# Patient Record
Sex: Male | Born: 1965 | Hispanic: No | Marital: Single | Smoking: Never smoker
Health system: Southern US, Community
[De-identification: ages and names within clinical notes are randomized; demographics above are authoritative.]

## PROBLEM LIST (undated history)

## (undated) DIAGNOSIS — E119 Type 2 diabetes mellitus without complications: Secondary | ICD-10-CM

## (undated) DIAGNOSIS — I2699 Other pulmonary embolism without acute cor pulmonale: Secondary | ICD-10-CM

## (undated) DIAGNOSIS — E785 Hyperlipidemia, unspecified: Secondary | ICD-10-CM

## (undated) DIAGNOSIS — E079 Disorder of thyroid, unspecified: Secondary | ICD-10-CM

## (undated) DIAGNOSIS — I10 Essential (primary) hypertension: Secondary | ICD-10-CM

## (undated) DIAGNOSIS — M109 Gout, unspecified: Secondary | ICD-10-CM

## (undated) HISTORY — DX: Type 2 diabetes mellitus without complications: E11.9

## (undated) HISTORY — PX: OTHER SURGICAL HISTORY: SHX169

## (undated) HISTORY — DX: Essential (primary) hypertension: I10

## (undated) HISTORY — DX: Gout, unspecified: M10.9

## (undated) HISTORY — DX: Disorder of thyroid, unspecified: E07.9

## (undated) HISTORY — DX: Hyperlipidemia, unspecified: E78.5

## (undated) HISTORY — PX: BACK SURGERY: SHX140

## (undated) HISTORY — DX: Other pulmonary embolism without acute cor pulmonale: I26.99

---

## 2004-02-22 ENCOUNTER — Emergency Department: Payer: Self-pay | Admitting: Emergency Medicine

## 2004-12-11 ENCOUNTER — Ambulatory Visit: Payer: Self-pay | Admitting: Family Medicine

## 2004-12-21 ENCOUNTER — Ambulatory Visit: Payer: Self-pay | Admitting: Family Medicine

## 2005-05-26 ENCOUNTER — Ambulatory Visit: Payer: Self-pay | Admitting: Family Medicine

## 2005-05-28 ENCOUNTER — Ambulatory Visit: Payer: Self-pay | Admitting: Urology

## 2005-06-03 ENCOUNTER — Ambulatory Visit: Payer: Self-pay | Admitting: Urology

## 2005-07-12 ENCOUNTER — Ambulatory Visit: Payer: Self-pay | Admitting: Urology

## 2007-03-31 ENCOUNTER — Ambulatory Visit: Payer: Self-pay | Admitting: Urology

## 2007-04-06 ENCOUNTER — Encounter: Admission: RE | Admit: 2007-04-06 | Discharge: 2007-04-06 | Payer: Self-pay | Admitting: Occupational Medicine

## 2008-03-27 ENCOUNTER — Encounter: Admission: RE | Admit: 2008-03-27 | Discharge: 2008-03-27 | Payer: Self-pay | Admitting: Occupational Medicine

## 2008-04-04 ENCOUNTER — Ambulatory Visit: Payer: Self-pay | Admitting: Urology

## 2008-04-21 ENCOUNTER — Emergency Department: Payer: Self-pay | Admitting: Emergency Medicine

## 2008-07-06 ENCOUNTER — Emergency Department: Payer: Self-pay | Admitting: Emergency Medicine

## 2008-10-30 ENCOUNTER — Ambulatory Visit: Payer: Self-pay | Admitting: Family Medicine

## 2008-12-07 ENCOUNTER — Ambulatory Visit: Payer: Self-pay | Admitting: Otolaryngology

## 2009-03-27 ENCOUNTER — Encounter: Admission: RE | Admit: 2009-03-27 | Discharge: 2009-03-27 | Payer: Self-pay | Admitting: Family Medicine

## 2010-06-22 ENCOUNTER — Ambulatory Visit: Payer: Self-pay | Admitting: Otolaryngology

## 2010-07-10 ENCOUNTER — Ambulatory Visit
Admission: RE | Admit: 2010-07-10 | Discharge: 2010-07-10 | Disposition: A | Discharge status: Home / Self Care | Payer: Self-pay | Source: Ambulatory Visit | Attending: Family Medicine | Admitting: Family Medicine

## 2010-07-10 ENCOUNTER — Other Ambulatory Visit: Payer: Self-pay | Admitting: Family Medicine

## 2010-07-10 DIAGNOSIS — Z0000 Encounter for general adult medical examination without abnormal findings: Secondary | ICD-10-CM

## 2010-08-01 IMAGING — CR DG CHEST 1V
1 series · 1 of 1 positions shown · non-contrast
Comparison: 04/06/2007

CLINICAL DATA: Physical clearance.

CHEST - 1 VIEW

[view not recorded]
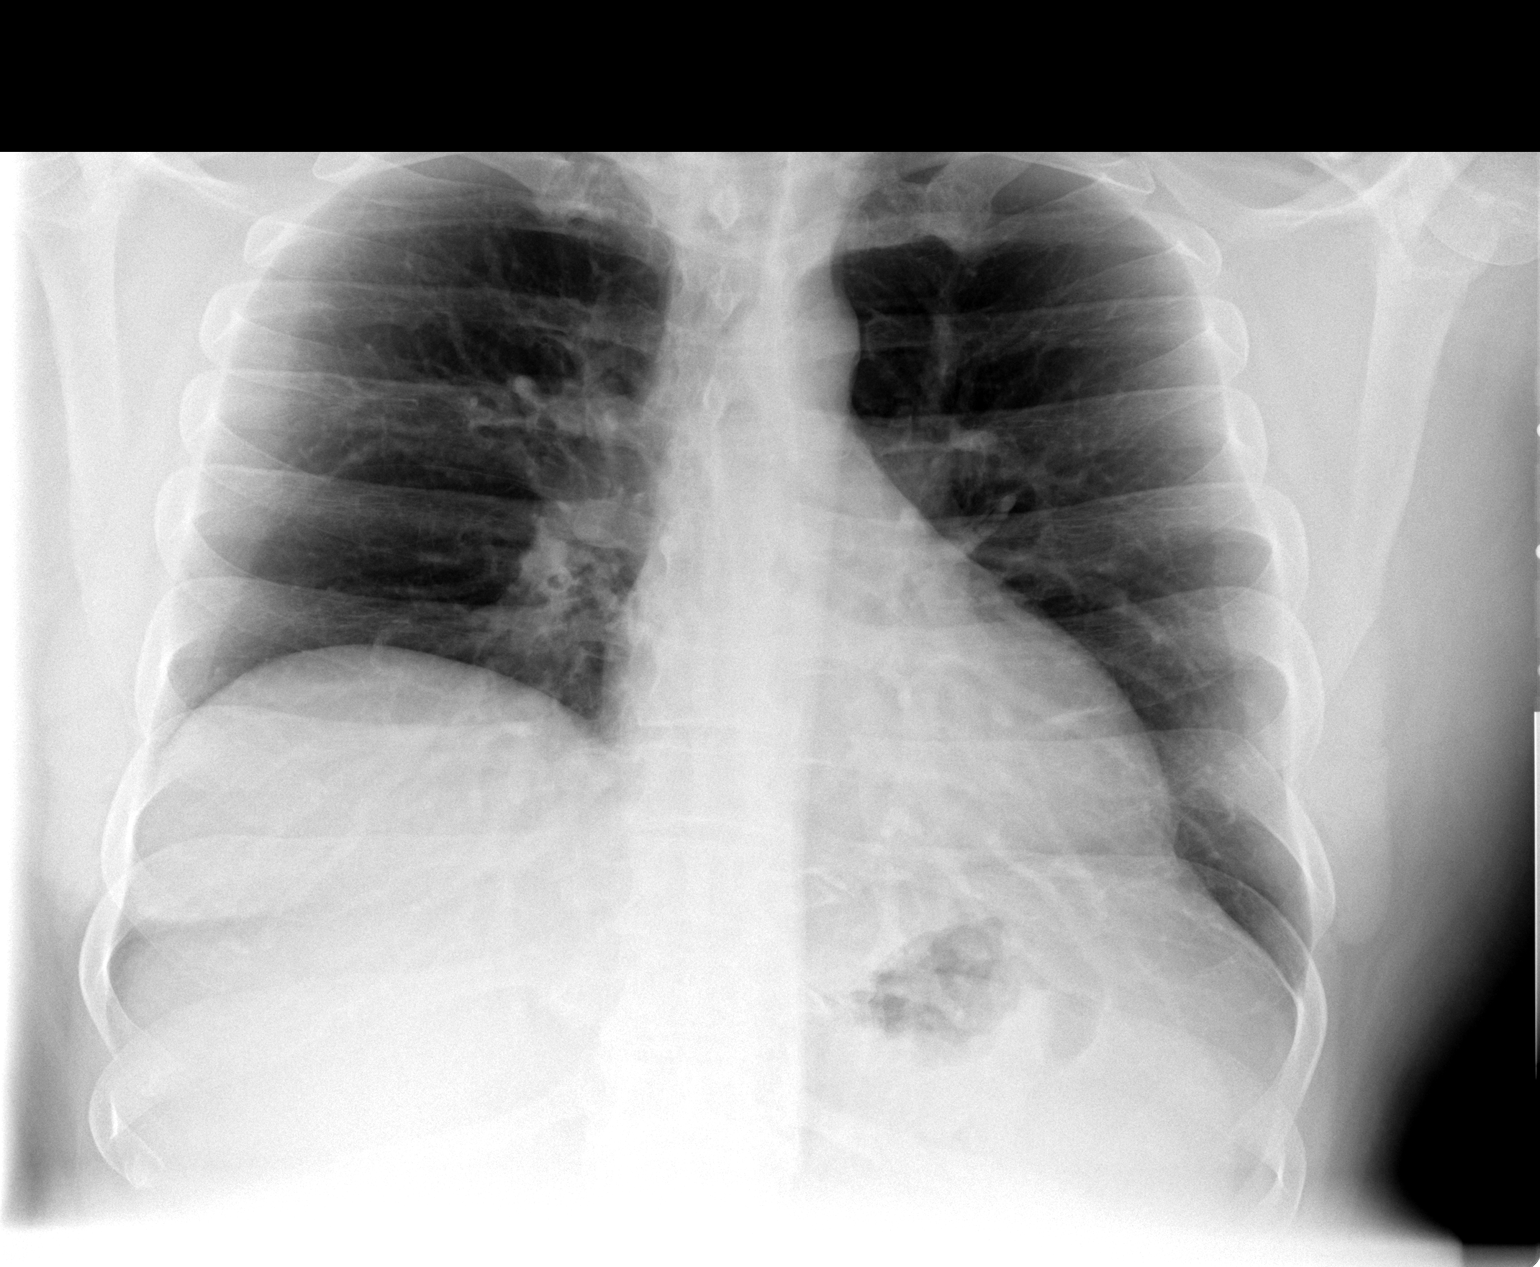

[1 of 1 positions shown; findings below may reference images not displayed]

FINDINGS: Trachea is midline.  Heart size stable.  Lungs are clear.
No pleural fluid.
IMPRESSION: No acute findings.

## 2011-03-14 ENCOUNTER — Emergency Department: Payer: Self-pay | Admitting: Emergency Medicine

## 2011-03-22 ENCOUNTER — Ambulatory Visit: Payer: Self-pay | Admitting: Family Medicine

## 2011-10-20 ENCOUNTER — Ambulatory Visit: Payer: Self-pay | Admitting: Emergency Medicine

## 2011-10-20 LAB — URINALYSIS, COMPLETE
Bilirubin,UR: NEGATIVE
Leukocyte Esterase: NEGATIVE
Ph: 5 (ref 4.5–8.0)
Protein: 30
Squamous Epithelial: NONE SEEN

## 2011-11-04 ENCOUNTER — Ambulatory Visit: Payer: Self-pay | Admitting: Urology

## 2011-11-10 ENCOUNTER — Ambulatory Visit: Payer: Self-pay | Admitting: Urology

## 2011-11-16 ENCOUNTER — Ambulatory Visit: Payer: Self-pay | Admitting: Urology

## 2011-11-16 LAB — BASIC METABOLIC PANEL
Anion Gap: 3 — ABNORMAL LOW (ref 7–16)
BUN: 13 mg/dL (ref 7–18)
Chloride: 104 mmol/L (ref 98–107)
Co2: 31 mmol/L (ref 21–32)
Creatinine: 1.37 mg/dL — ABNORMAL HIGH (ref 0.60–1.30)
Potassium: 3.5 mmol/L (ref 3.5–5.1)
Sodium: 138 mmol/L (ref 136–145)

## 2011-12-02 DIAGNOSIS — N200 Calculus of kidney: Secondary | ICD-10-CM | POA: Insufficient documentation

## 2011-12-02 DIAGNOSIS — R3129 Other microscopic hematuria: Secondary | ICD-10-CM | POA: Insufficient documentation

## 2012-03-03 ENCOUNTER — Ambulatory Visit: Payer: Self-pay | Admitting: Urology

## 2012-03-03 DIAGNOSIS — N200 Calculus of kidney: Secondary | ICD-10-CM | POA: Insufficient documentation

## 2012-03-16 ENCOUNTER — Ambulatory Visit: Payer: Self-pay | Admitting: Urology

## 2012-03-31 ENCOUNTER — Ambulatory Visit: Payer: Self-pay | Admitting: Urology

## 2012-08-11 ENCOUNTER — Other Ambulatory Visit: Payer: Self-pay | Admitting: Emergency Medicine

## 2012-08-11 ENCOUNTER — Ambulatory Visit (INDEPENDENT_AMBULATORY_CARE_PROVIDER_SITE_OTHER): Payer: Self-pay

## 2012-08-11 DIAGNOSIS — Z0000 Encounter for general adult medical examination without abnormal findings: Secondary | ICD-10-CM

## 2013-06-29 ENCOUNTER — Emergency Department: Payer: Self-pay | Admitting: Emergency Medicine

## 2013-06-29 LAB — CBC
HCT: 47.1 % (ref 40.0–52.0)
HGB: 15.6 g/dL (ref 13.0–18.0)
MCH: 30.5 pg (ref 26.0–34.0)
MCHC: 33 g/dL (ref 32.0–36.0)
MCV: 92 fL (ref 80–100)
Platelet: 218 10*3/uL (ref 150–440)
RBC: 5.1 10*6/uL (ref 4.40–5.90)
RDW: 14.7 % — AB (ref 11.5–14.5)
WBC: 9.5 10*3/uL (ref 3.8–10.6)

## 2013-06-29 LAB — LIPASE, BLOOD: Lipase: 328 U/L (ref 73–393)

## 2013-06-29 LAB — URINALYSIS, COMPLETE
BACTERIA: NONE SEEN
BILIRUBIN, UR: NEGATIVE
BLOOD: NEGATIVE
Ketone: NEGATIVE
LEUKOCYTE ESTERASE: NEGATIVE
NITRITE: NEGATIVE
PH: 5 (ref 4.5–8.0)
Protein: NEGATIVE
RBC, UR: NONE SEEN /HPF (ref 0–5)
SPECIFIC GRAVITY: 1.038 (ref 1.003–1.030)
Squamous Epithelial: <1
WBC UR: NONE SEEN /HPF (ref 0–5)

## 2013-06-29 LAB — D-DIMER(ARMC): D-DIMER: 644 ng/mL

## 2013-06-29 LAB — COMPREHENSIVE METABOLIC PANEL
ALBUMIN: 3.7 g/dL (ref 3.4–5.0)
AST: 31 U/L (ref 15–37)
Alkaline Phosphatase: 55 U/L
Anion Gap: 5 — ABNORMAL LOW (ref 7–16)
BUN: 12 mg/dL (ref 7–18)
Bilirubin,Total: 0.3 mg/dL (ref 0.2–1.0)
CALCIUM: 8.6 mg/dL (ref 8.5–10.1)
Chloride: 106 mmol/L (ref 98–107)
Co2: 28 mmol/L (ref 21–32)
Creatinine: 0.93 mg/dL (ref 0.60–1.30)
Glucose: 198 mg/dL — ABNORMAL HIGH (ref 65–99)
Osmolality: 283 (ref 275–301)
Potassium: 3.8 mmol/L (ref 3.5–5.1)
SGPT (ALT): 37 U/L (ref 12–78)
Sodium: 139 mmol/L (ref 136–145)
TOTAL PROTEIN: 7.6 g/dL (ref 6.4–8.2)

## 2013-06-29 LAB — CK TOTAL AND CKMB (NOT AT ARMC)
CK, TOTAL: 93 U/L
CK-MB: 0.9 ng/mL (ref 0.5–3.6)

## 2013-06-29 LAB — TROPONIN I: Troponin-I: <0.02 ng/mL

## 2013-09-13 DIAGNOSIS — E119 Type 2 diabetes mellitus without complications: Secondary | ICD-10-CM | POA: Insufficient documentation

## 2013-09-13 DIAGNOSIS — E669 Obesity, unspecified: Secondary | ICD-10-CM | POA: Insufficient documentation

## 2013-09-13 DIAGNOSIS — E039 Hypothyroidism, unspecified: Secondary | ICD-10-CM | POA: Insufficient documentation

## 2013-09-23 DIAGNOSIS — E538 Deficiency of other specified B group vitamins: Secondary | ICD-10-CM | POA: Insufficient documentation

## 2013-09-23 DIAGNOSIS — R809 Proteinuria, unspecified: Secondary | ICD-10-CM | POA: Insufficient documentation

## 2014-07-02 NOTE — Op Note (Signed)
PATIENT NAME:  Delfin, Toniann FailONY L MR#:  [id number] DATE OF BIRTH:  [birth year]  DATE OF PROCEDURE:  11/16/2011  PREOPERATIVE DIAGNOSIS: Left ureterolithiasis.   POSTOPERATIVE DIAGNOSIS: Left ureterolithiasis.   PROCEDURES: 1. Left ureteroscopy with holmium laser lithotripsy.  2. Left double-J ureteral stent placement.   SURGEON: Madolyn FriezeBrian S. Achilles Dunkope, MD  ANESTHESIA: Laryngeal mask airway anesthesia.   INDICATIONS: The patient is a 49 year old gentleman with a history of uric acid nephrolithiasis. He presented with recent onset of left-sided flank pain. A KUB demonstrated no definitive stones overlying the course of the ureter. Several bilateral renal calculi were appreciated. A subsequent CT scan, however, demonstrated an approximate 1.2 cm stone in the mid left ureter with partial obstruction. He presents for ureteroscopic stone removal.   DESCRIPTION OF PROCEDURE: After informed consent was obtained, the patient was taken to the Operating Room and placed in the dorsal lithotomy position under laryngeal mask airway anesthesia. The patient was then prepped and draped in the usual standard fashion. The 22 French rigid cystoscope was introduced into the urethra under direct vision with no urethral abnormalities noted. Upon entering the prostatic fossa, moderate bilobar hypertrophy was noted with partial visual obstruction noted. Upon entering the bladder, the mucosa was inspected in its entirety with no gross mucosal lesions noted. Bilateral ureteral orifices were well visualized with no lesions noted. A flexible tip Glidewire was introduced into the left ureteral orifice. It was advanced into the upper pole collecting system under fluoroscopic guidance without difficulty. Moderate bleeding was noted from the left ureteral orifice. The cystoscope was removed. The 6 French rigid ureteroscope was advanced into the urinary bladder. It was advanced into the left ureteral orifice without difficulty. It was advanced  to the level of the crossing vessels with significant inflammatory tissue noted. As the inflammatory tissue was separated utilizing the scope a prominent stone was encountered. The holmium laser fiber was then utilized to fragment the stone into multiple smaller pieces. The stone was noted to be very hard requiring significant power and prolonged fragmentation for adequate stone breakage. The ureter proximal to the impacted stone was noted to be significantly dilated. Multiple stone fragments were present in the dilated portion of the ureter. These were additionally fragmented into smaller pieces. A basket was then utilized to remove multiple stone fragments. Several of the stone fragments were noted to be too large for stone basket retrieval. The basket was disengaged with a large stone still within the tines. The holmium laser was then utilized to fragment the stone into multiple smaller pieces. These were then basket extracted with a second basket. The scope was advanced into the renal pelvis with some smaller stone fragments remaining. Prominent dilation of the upper collecting system was appreciated. Extensive inflammation at the site of stone impaction was noted. The decision was made to place a stent. A 7 French x 26 cm double-J ureteral stent was advanced over the previously placed guidewire into the left renal pelvis under fluoroscopic guidance without difficulty. Adequate curl was noted within the renal pelvis. Adequate curl was also noted within the urinary bladder. The stone fragments were irrigated free from the bladder. They were collected and will be sent for stone analysis. The bladder was then drained. The cystoscope was removed. The patient was returned to the supine position and awakened from laryngeal mask airway anesthesia. He was taken to the recovery room in stable condition. There were no problems or complications. Patient tolerated the procedure well.   ____________________________ Madolyn FriezeBrian  S. Achilles Dunkope,  MD bsc:cms D: 11/16/2011 23:07:05 ET T: 11/17/2011 10:46:59 ET JOB#: [id number]  cc: Madolyn Frieze. Achilles Dunk, MD, <Dictator> Madolyn Frieze Taahir Grisby MD ELECTRONICALLY SIGNED 11/18/2011 11:54

## 2014-07-08 IMAGING — CR DG ABDOMEN 1V
1 series · 1 of 1 positions shown · non-contrast
Comparison: none

REASON FOR EXAM: nephrolithiasis and renal colic
COMMENTS:

[supine kub]
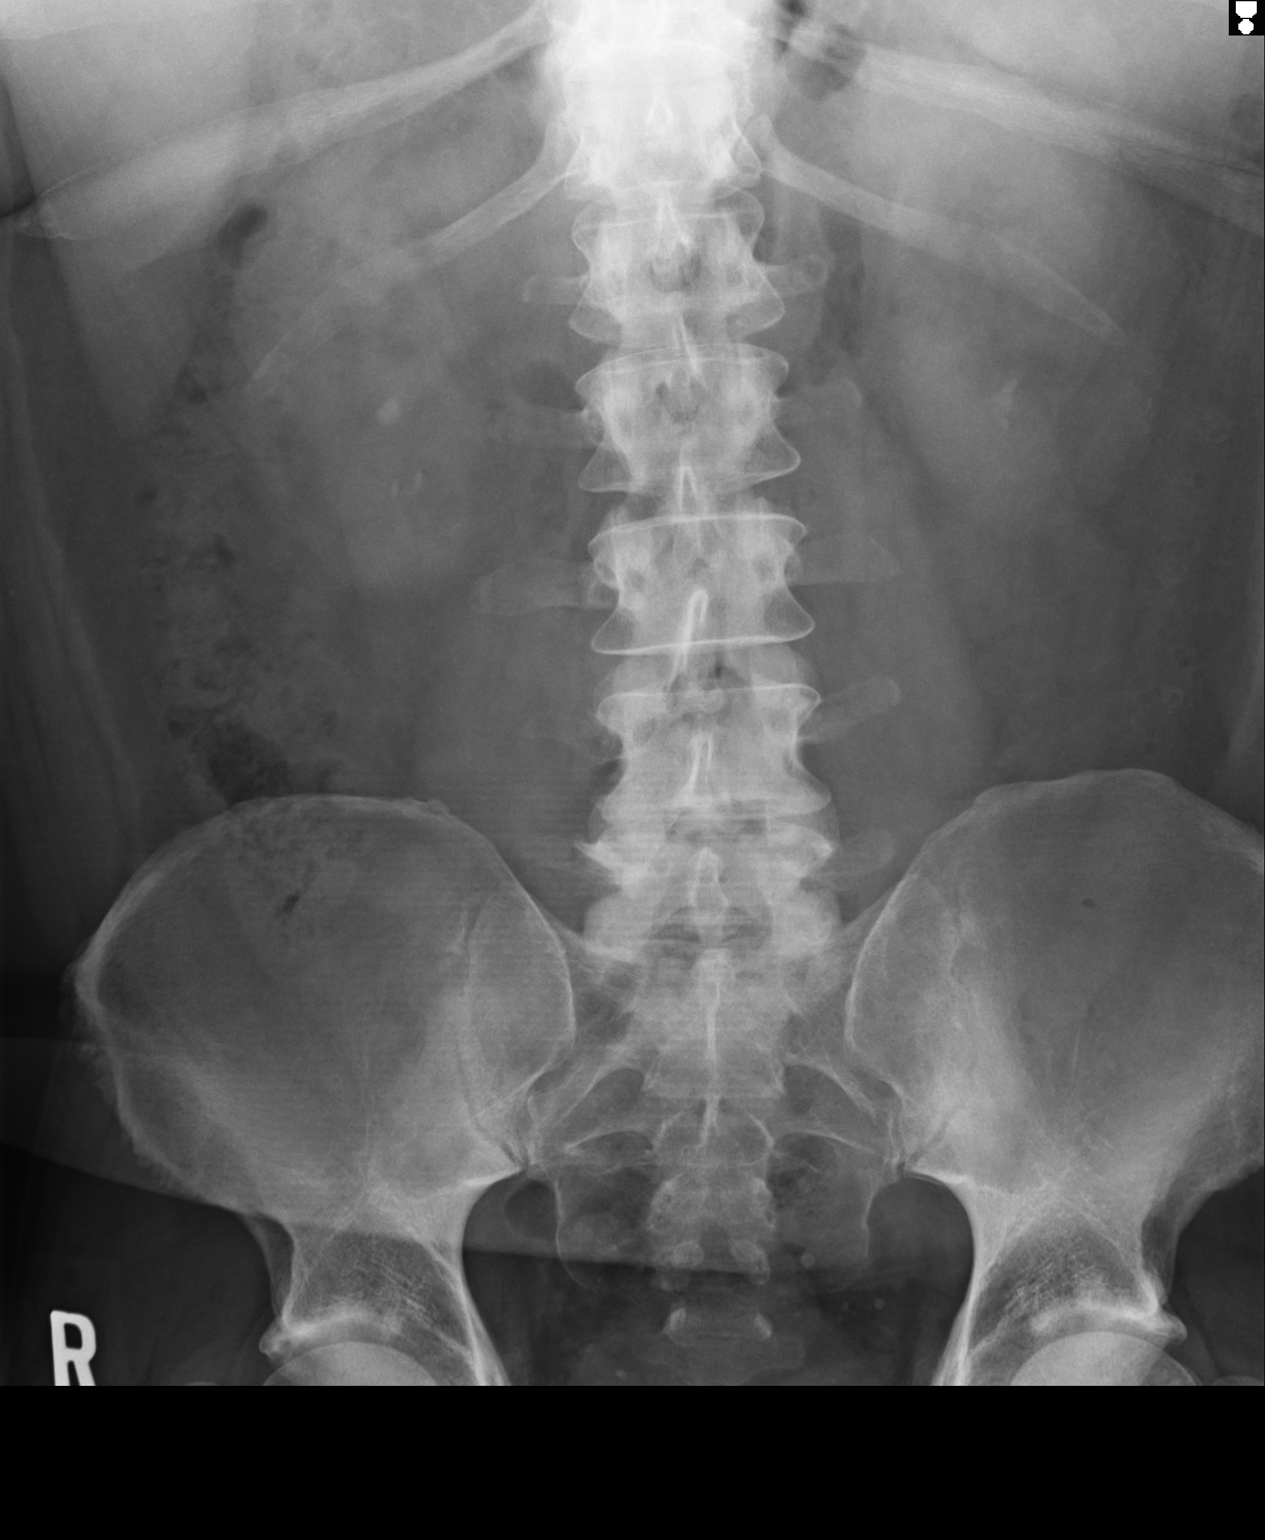

[1 of 1 positions shown; findings below may reference images not displayed]

PROCEDURE:     DXR - DXR KIDNEY URETER BLADDER  - March 03, 2012  [DATE]

RESULT:     There are coarse calcifications projecting over both kidneys in
the lower pole regions. A mid to lower pole calcification on the right is
demonstrated which measures 7 mm in diameter. There are phleboliths within
the pelvis which appears stable.
IMPRESSION: There coarse stones projecting over the mid to lower pole
of the right kidney and over the lower pole of the left kidney.

[REDACTED]

## 2014-09-24 ENCOUNTER — Other Ambulatory Visit: Payer: Self-pay | Admitting: Family Medicine

## 2014-09-24 DIAGNOSIS — E119 Type 2 diabetes mellitus without complications: Secondary | ICD-10-CM

## 2014-09-24 DIAGNOSIS — E785 Hyperlipidemia, unspecified: Secondary | ICD-10-CM

## 2014-09-24 DIAGNOSIS — I10 Essential (primary) hypertension: Secondary | ICD-10-CM

## 2014-10-17 ENCOUNTER — Other Ambulatory Visit: Payer: Self-pay | Admitting: Family Medicine

## 2014-11-05 ENCOUNTER — Other Ambulatory Visit: Payer: Self-pay | Admitting: Family Medicine

## 2014-11-05 DIAGNOSIS — E785 Hyperlipidemia, unspecified: Secondary | ICD-10-CM

## 2014-11-05 DIAGNOSIS — I10 Essential (primary) hypertension: Secondary | ICD-10-CM

## 2014-11-19 ENCOUNTER — Ambulatory Visit (INDEPENDENT_AMBULATORY_CARE_PROVIDER_SITE_OTHER): Payer: BLUE CROSS/BLUE SHIELD | Admitting: Family Medicine

## 2014-11-19 ENCOUNTER — Encounter: Payer: Self-pay | Admitting: Family Medicine

## 2014-11-19 VITALS — BP 120/80 | HR 80 | Ht 73.0 in | Wt 250.0 lb

## 2014-11-19 DIAGNOSIS — E785 Hyperlipidemia, unspecified: Secondary | ICD-10-CM | POA: Insufficient documentation

## 2014-11-19 DIAGNOSIS — M791 Myalgia, unspecified site: Secondary | ICD-10-CM

## 2014-11-19 DIAGNOSIS — K219 Gastro-esophageal reflux disease without esophagitis: Secondary | ICD-10-CM | POA: Diagnosis not present

## 2014-11-19 DIAGNOSIS — J329 Chronic sinusitis, unspecified: Secondary | ICD-10-CM | POA: Diagnosis not present

## 2014-11-19 DIAGNOSIS — I10 Essential (primary) hypertension: Secondary | ICD-10-CM | POA: Diagnosis not present

## 2014-11-19 DIAGNOSIS — Z23 Encounter for immunization: Secondary | ICD-10-CM | POA: Diagnosis not present

## 2014-11-19 MED ORDER — PANTOPRAZOLE SODIUM 40 MG PO TBEC: 40.0000 mg | DELAYED_RELEASE_TABLET | Freq: Every day | ORAL | Status: AC

## 2014-11-19 MED ORDER — AMOXICILLIN 500 MG PO CAPS: 500.0000 mg | ORAL_CAPSULE | Freq: Three times a day (TID) | ORAL | Status: DC

## 2014-11-19 MED ORDER — LISINOPRIL-HYDROCHLOROTHIAZIDE 10-12.5 MG PO TABS: ORAL_TABLET | ORAL | Status: DC

## 2014-11-19 NOTE — Patient Instructions (Signed)

## 2014-11-19 NOTE — Progress Notes (Signed)
Name: Jeffery Howell   MRN: [id number]    DOB: 01-10-1966   Date:11/19/2014       Progress Note  Subjective  Chief Complaint  Chief Complaint  Patient presents with  . Hyperlipidemia    discuss changing med due to joint pain/ cramps in arms, legs and hands  . Gout  . Gastrophageal Reflux  . Hypertension    Hyperlipidemia This is a recurrent problem. The current episode started more than 1 month ago. The problem is controlled. Recent lipid tests were reviewed and are low. He has no history of chronic renal disease, diabetes, hypothyroidism, liver disease, obesity or nephrotic syndrome. There are no known factors aggravating his hyperlipidemia. Pertinent negatives include no chest pain, focal sensory loss, focal weakness, leg pain, myalgias or shortness of breath. Current antihyperlipidemic treatment includes statins. The current treatment provides mild improvement of lipids. There are no compliance problems.  Risk factors for coronary artery disease include diabetes mellitus, dyslipidemia and hypertension.  Gastrophageal Reflux He reports no abdominal pain, no belching, no chest pain, no choking, no coughing, no dysphagia, no early satiety, no globus sensation, no heartburn, no hoarse voice, no nausea, no sore throat, no stridor, no tooth decay, no water brash or no wheezing. This is a chronic problem. The current episode started more than 1 year ago. The problem occurs occasionally. The problem has been gradually improving. Pertinent negatives include no melena or weight loss. There are no known risk factors. He has tried a PPI for the symptoms. The treatment provided mild relief.  Hypertension This is a recurrent problem. The current episode started more than 1 year ago. The problem has been gradually improving since onset. The problem is controlled. Pertinent negatives include no anxiety, blurred vision, chest pain, headaches, malaise/fatigue, neck pain, orthopnea, palpitations, peripheral  edema, PND, shortness of breath or sweats. Agents associated with hypertension include thyroid hormones. Past treatments include ACE inhibitors and diuretics. The current treatment provides moderate improvement. There are no compliance problems.  There is no history of angina, kidney disease, CAD/MI, CVA, heart failure, left ventricular hypertrophy, PVD, renovascular disease or retinopathy. There is no history of chronic renal disease.    No problem-specific assessment & plan notes found for this encounter.   Past Medical History  Diagnosis Date  . Diabetes mellitus without complication   . Hypertension   . Hyperlipidemia   . Pulmonary embolism   . Thyroid disease   . Gout     Past Surgical History  Procedure Laterality Date  . None      Family History  Problem Relation Age of Onset  . Diabetes Father   . Heart disease Father   . Diabetes Sister     Social History   Social History  . Marital Status: Single    Spouse Name: N/A  . Number of Children: N/A  . Years of Education: N/A   Occupational History  . Not on file.   Social History Main Topics  . Smoking status: Never Smoker   . Smokeless tobacco: Not on file  . Alcohol Use: 0.0 oz/week    0 Standard drinks or equivalent per week  . Drug Use: No  . Sexual Activity: Yes   Other Topics Concern  . Not on file   Social History Narrative  . No narrative on file    Allergies  Allergen Reactions  . Sulfa Antibiotics Other (See Comments)     Review of Systems  Constitutional: Negative for fever, chills, weight loss  and malaise/fatigue.  HENT: Negative for ear discharge, ear pain, hoarse voice and sore throat.   Eyes: Negative for blurred vision.  Respiratory: Negative for cough, sputum production, choking, shortness of breath and wheezing.   Cardiovascular: Negative for chest pain, palpitations, orthopnea, leg swelling and PND.  Gastrointestinal: Negative for heartburn, dysphagia, nausea, abdominal pain,  diarrhea, constipation, blood in stool and melena.  Genitourinary: Negative for dysuria, urgency, frequency and hematuria.  Musculoskeletal: Negative for myalgias, back pain, joint pain and neck pain.  Skin: Negative for rash.  Neurological: Negative for dizziness, tingling, sensory change, focal weakness and headaches.  Endo/Heme/Allergies: Negative for environmental allergies and polydipsia. Does not bruise/bleed easily.  Psychiatric/Behavioral: Negative for depression and suicidal ideas. The patient is not nervous/anxious and does not have insomnia.      Objective  Filed Vitals:   11/19/14 0811  BP: 120/80  Pulse: 80  Height:  (1.854 m)  Weight: 250 lb (113.399 kg)    Physical Exam  Constitutional: He is oriented to person, place, and time and well-developed, well-nourished, and in no distress.  HENT:  Head: Normocephalic.  Right Ear: External ear normal.  Left Ear: External ear normal.  Nose: Nose normal.  Mouth/Throat: Oropharynx is clear and moist.  Eyes: Conjunctivae and EOM are normal. Pupils are equal, round, and reactive to light. Right eye exhibits no discharge. Left eye exhibits no discharge. No scleral icterus.  Neck: Normal range of motion. Neck supple. No JVD present. No tracheal deviation present. No thyromegaly present.  Cardiovascular: Normal rate, regular rhythm, normal heart sounds and intact distal pulses.  Exam reveals no gallop and no friction rub.   No murmur heard. Pulmonary/Chest: Breath sounds normal. No respiratory distress. He has no wheezes. He has no rales.  Abdominal: Soft. Bowel sounds are normal. He exhibits no mass. There is no hepatosplenomegaly. There is no tenderness. There is no rebound, no guarding and no CVA tenderness.  Musculoskeletal: Normal range of motion. He exhibits no edema or tenderness.  Lymphadenopathy:    He has no cervical adenopathy.  Neurological: He is alert and oriented to person, place, and time. He has normal  sensation, normal strength, normal reflexes and intact cranial nerves. No cranial nerve deficit.  Skin: Skin is warm. No rash noted.  Psychiatric: Mood and affect normal.      Assessment & Plan  Problem List Items Addressed This Visit      Cardiovascular and Mediastinum   Essential hypertension - Primary   Relevant Medications   lisinopril-hydrochlorothiazide (PRINZIDE,ZESTORETIC) 10-12.5 MG per tablet   Other Relevant Orders   Renal Function Panel     Respiratory   Chronic infection of sinus   Relevant Medications   amoxicillin (AMOXIL) 500 MG capsule     Digestive   Esophageal reflux   Relevant Medications   pantoprazole (PROTONIX) 40 MG tablet     Other   Hyperlipidemia   Relevant Medications   lisinopril-hydrochlorothiazide (PRINZIDE,ZESTORETIC) 10-12.5 MG per tablet   Myalgia    Other Visit Diagnoses    Influenza vaccine needed        Relevant Orders    Flu Vaccine QUAD 36+ mos PF IM (Fluarix & Fluzone Quad PF) (Completed)         Dr. Hayden [facility]  11/19/2014

## 2014-11-20 LAB — RENAL FUNCTION PANEL
ALBUMIN: 4.6 g/dL (ref 3.5–5.5)
BUN / CREAT RATIO: 18 (ref 9–20)
BUN: 16 mg/dL (ref 6–24)
CHLORIDE: 98 mmol/L (ref 97–108)
CO2: 24 mmol/L (ref 18–29)
CREATININE: 0.9 mg/dL (ref 0.76–1.27)
Calcium: 9.2 mg/dL (ref 8.7–10.2)
GFR, EST AFRICAN AMERICAN: 116 mL/min/{1.73_m2} (ref >59)
GFR, EST NON AFRICAN AMERICAN: 101 mL/min/{1.73_m2} (ref >59)
Glucose: 162 mg/dL — ABNORMAL HIGH (ref 65–99)
Phosphorus: 2.6 mg/dL (ref 2.5–4.5)
Potassium: 4.4 mmol/L (ref 3.5–5.2)
Sodium: 141 mmol/L (ref 134–144)

## 2014-12-31 ENCOUNTER — Encounter: Payer: Self-pay | Admitting: Family Medicine

## 2014-12-31 ENCOUNTER — Ambulatory Visit (INDEPENDENT_AMBULATORY_CARE_PROVIDER_SITE_OTHER): Payer: BLUE CROSS/BLUE SHIELD | Admitting: Family Medicine

## 2014-12-31 VITALS — BP 110/80 | HR 80 | Ht 73.0 in | Wt 251.0 lb

## 2014-12-31 DIAGNOSIS — IMO0002 Reserved for concepts with insufficient information to code with codable children: Secondary | ICD-10-CM

## 2014-12-31 DIAGNOSIS — G629 Polyneuropathy, unspecified: Secondary | ICD-10-CM | POA: Diagnosis not present

## 2014-12-31 DIAGNOSIS — I10 Essential (primary) hypertension: Secondary | ICD-10-CM

## 2014-12-31 DIAGNOSIS — M6530 Trigger finger, unspecified finger: Secondary | ICD-10-CM

## 2014-12-31 DIAGNOSIS — S0432XD Injury of trigeminal nerve, left side, subsequent encounter: Secondary | ICD-10-CM

## 2014-12-31 MED ORDER — GABAPENTIN 100 MG PO CAPS: 100.0000 mg | ORAL_CAPSULE | Freq: Three times a day (TID) | ORAL | Status: AC

## 2014-12-31 MED ORDER — LISINOPRIL-HYDROCHLOROTHIAZIDE 10-12.5 MG PO TABS: ORAL_TABLET | ORAL | Status: AC

## 2014-12-31 NOTE — Patient Instructions (Signed)
Trigeminal Neuralgia °Trigeminal neuralgia is a nerve disorder that causes attacks of severe facial pain. The attacks last from a few seconds to several minutes. They can happen for days, weeks, or months and then go away for months or years. Trigeminal neuralgia is also called tic douloureux. °CAUSES °This condition is caused by damage to a nerve in the face that is called the trigeminal nerve. An attack can be triggered by: °· Talking. °· Chewing. °· Putting on makeup. °· Washing your face. °· Shaving your face. °· Brushing your teeth. °· Touching your face. °RISK FACTORS °This condition is more likely to develop in: °· Women. °· People who are 50 years of age or older. °SYMPTOMS °The main symptom of this condition is pain in the jaw, lips, eyes, nose, scalp, forehead, and face. The pain may be intense, stabbing, electric, or shock-like. °DIAGNOSIS °This condition is diagnosed with a physical exam. A CT scan or MRI may be done to rule out other conditions that can cause facial pain. °TREATMENT °This condition may be treated with: °· Avoiding the things that trigger your attacks. °· Pain medicine. °· Surgery. This may be done in severe cases if other medical treatment does not provide relief. °HOME CARE INSTRUCTIONS °· Take over-the-counter and prescription medicines only as told by your health care provider. °· If you wish to get pregnant, talk with your health care provider before you start trying to get pregnant. °· Avoid the things that trigger your attacks. It may help to: °¨ Chew on the unaffected side of your mouth. °¨ Avoid touching your face. °¨ Avoid blasts of hot or cold air. °SEEK MEDICAL CARE IF: °· Your pain medicine is not helping. °· You develop new, unexplained symptoms, such as: °¨ Double vision. °¨ Facial weakness. °¨ Changes in hearing or balance. °· You become pregnant. °SEEK IMMEDIATE MEDICAL CARE IF: °· Your pain is unbearable, and your pain medicine does not help. °  °This information is not  intended to replace advice given to you by your health care provider. Make sure you discuss any questions you have with your health care provider. °  °Document Released: 02/27/2000 Document Revised: 11/20/2014 Document Reviewed: 06/24/2014 °Elsevier Interactive Patient Education ©2016 [employer] ° °

## 2014-12-31 NOTE — Progress Notes (Signed)
Name: Jeffery Howell   MRN: [id number]    DOB: 1965/08/08   Date:12/31/2014       Progress Note  Subjective  Chief Complaint  Chief Complaint  Patient presents with  . Facial Pain    has a "facial pressure on Right side of face"- was seen by vascular MD approx a year ago and was told "veins are more narrow on that side after having a MRI" Not so much a pain as a "pressure sensation" of face. Not noticed any changes in smile. Occassional "locking of both hands and Left foot"    Hypertension The current episode started more than 1 year ago. The problem has been waxing and waning since onset. The problem is controlled. Pertinent negatives include no blurred vision, chest pain, headaches, malaise/fatigue, neck pain, orthopnea, palpitations, peripheral edema, PND or shortness of breath. There are no associated agents to hypertension. The current treatment provides moderate improvement.  Neurologic Problem The patient's pertinent negatives include no altered mental status, clumsiness, focal sensory loss, focal weakness, loss of balance, memory loss, near-syncope, slurred speech, syncope, visual change or weakness. This is a recurrent problem. The current episode started more than 1 year ago. The neurological problem developed gradually. The problem has been waxing and waning since onset. There was facial focality noted. Pertinent negatives include no abdominal pain, auditory change, aura, back pain, bladder incontinence, bowel incontinence, chest pain, confusion, diaphoresis, dizziness, fatigue, fever, headaches, light-headedness, nausea, neck pain, palpitations, shortness of breath, vertigo or vomiting. Past treatments include aspirin. The treatment provided mild relief. There is no history of a bleeding disorder, a clotting disorder, a CVA, dementia, head trauma, liver disease, mood changes or seizures.    No problem-specific assessment & plan notes found for this encounter.   Past Medical History   Diagnosis Date  . Diabetes mellitus without complication (HCC)   . Hypertension   . Hyperlipidemia   . Pulmonary embolism (HCC)   . Thyroid disease   . Gout     Past Surgical History  Procedure Laterality Date  . None      Family History  Problem Relation Age of Onset  . Diabetes Father   . Heart disease Father   . Diabetes Sister     Social History   Social History  . Marital Status: Single    Spouse Name: N/A  . Number of Children: N/A  . Years of Education: N/A   Occupational History  . Not on file.   Social History Main Topics  . Smoking status: Never Smoker   . Smokeless tobacco: Not on file  . Alcohol Use: 0.0 oz/week    0 Standard drinks or equivalent per week  . Drug Use: No  . Sexual Activity: Yes   Other Topics Concern  . Not on file   Social History Narrative    Allergies  Allergen Reactions  . Sulfa Antibiotics Other (See Comments)     Review of Systems  Constitutional: Negative for fever, chills, weight loss, malaise/fatigue, diaphoresis and fatigue.  HENT: Negative for ear discharge, ear pain and sore throat.   Eyes: Negative for blurred vision.  Respiratory: Negative for cough, sputum production, shortness of breath and wheezing.   Cardiovascular: Negative for chest pain, palpitations, orthopnea, leg swelling, PND and near-syncope.  Gastrointestinal: Negative for heartburn, nausea, vomiting, abdominal pain, diarrhea, constipation, blood in stool, melena and bowel incontinence.  Genitourinary: Negative for bladder incontinence, dysuria, urgency, frequency and hematuria.  Musculoskeletal: Negative for myalgias, back pain, joint  pain and neck pain.  Skin: Negative for rash.  Neurological: Positive for sensory change. Negative for dizziness, vertigo, tingling, focal weakness, syncope, weakness, light-headedness, headaches and loss of balance.  Endo/Heme/Allergies: Negative for environmental allergies and polydipsia. Does not bruise/bleed  easily.  Psychiatric/Behavioral: Negative for depression, suicidal ideas, memory loss and confusion. The patient is not nervous/anxious and does not have insomnia.      Objective  Filed Vitals:   12/31/14 0828  BP: 110/80  Pulse: 80  Height: 6\' 1"  (1.854 m)  Weight: 251 lb (113.853 kg)    Physical Exam  Constitutional: He is oriented to person, place, and time and well-developed, well-nourished, and in no distress.  HENT:  Head: Normocephalic.  Right Ear: External ear normal.  Left Ear: External ear normal.  Nose: Nose normal.  Mouth/Throat: Oropharynx is clear and moist.  Eyes: Conjunctivae and EOM are normal. Pupils are equal, round, and reactive to light. Right eye exhibits no discharge. Left eye exhibits no discharge. No scleral icterus.  Neck: Normal range of motion. Neck supple. No JVD present. No tracheal deviation present. No thyromegaly present.  Cardiovascular: Normal rate, regular rhythm, normal heart sounds and intact distal pulses.  Exam reveals no gallop and no friction rub.   No murmur heard. Pulmonary/Chest: Breath sounds normal. No respiratory distress. He has no wheezes. He has no rales.  Abdominal: Soft. Bowel sounds are normal. He exhibits no mass. There is no hepatosplenomegaly. There is no tenderness. There is no rebound, no guarding and no CVA tenderness.  Musculoskeletal: Normal range of motion. He exhibits no edema or tenderness.  Lymphadenopathy:    He has no cervical adenopathy.  Neurological: He is alert and oriented to person, place, and time. He has normal motor skills, normal sensation, normal strength, normal reflexes and intact cranial nerves. He displays no weakness. No cranial nerve deficit.  Skin: Skin is warm. No rash noted.  Psychiatric: Mood and affect normal.      Assessment & Plan  Problem List Items Addressed This Visit      Cardiovascular and Mediastinum   Essential hypertension   Relevant Medications    lisinopril-hydrochlorothiazide (PRINZIDE,ZESTORETIC) 10-12.5 MG tablet    Other Visit Diagnoses    Neuropathy (HCC)    -  Primary    Relevant Medications    gabapentin (NEURONTIN) 100 MG capsule    Trigger finger of both hands        Trigeminal nerve injury, left, subsequent encounter        Relevant Medications    gabapentin (NEURONTIN) 100 MG capsule         Dr. Hayden Rasmusseneanna Calyb [facility]  12/31/2014

## 2015-07-01 ENCOUNTER — Emergency Department (HOSPITAL_COMMUNITY)
Admission: EM | Admit: 2015-07-01 | Discharge: 2015-07-01 | Disposition: A | Discharge status: Home / Self Care | Payer: BLUE CROSS/BLUE SHIELD | Attending: Emergency Medicine | Admitting: Emergency Medicine

## 2015-07-01 ENCOUNTER — Emergency Department (HOSPITAL_COMMUNITY): Payer: BLUE CROSS/BLUE SHIELD

## 2015-07-01 ENCOUNTER — Encounter (HOSPITAL_COMMUNITY): Payer: Self-pay | Admitting: Emergency Medicine

## 2015-07-01 DIAGNOSIS — E785 Hyperlipidemia, unspecified: Secondary | ICD-10-CM | POA: Diagnosis not present

## 2015-07-01 DIAGNOSIS — E119 Type 2 diabetes mellitus without complications: Secondary | ICD-10-CM | POA: Insufficient documentation

## 2015-07-01 DIAGNOSIS — Y92410 Unspecified street and highway as the place of occurrence of the external cause: Secondary | ICD-10-CM | POA: Diagnosis not present

## 2015-07-01 DIAGNOSIS — S3991XA Unspecified injury of abdomen, initial encounter: Secondary | ICD-10-CM | POA: Diagnosis not present

## 2015-07-01 DIAGNOSIS — M5442 Lumbago with sciatica, left side: Secondary | ICD-10-CM | POA: Diagnosis not present

## 2015-07-01 DIAGNOSIS — S3992XA Unspecified injury of lower back, initial encounter: Secondary | ICD-10-CM | POA: Diagnosis present

## 2015-07-01 DIAGNOSIS — M109 Gout, unspecified: Secondary | ICD-10-CM | POA: Diagnosis not present

## 2015-07-01 DIAGNOSIS — Z7984 Long term (current) use of oral hypoglycemic drugs: Secondary | ICD-10-CM | POA: Insufficient documentation

## 2015-07-01 DIAGNOSIS — E079 Disorder of thyroid, unspecified: Secondary | ICD-10-CM | POA: Insufficient documentation

## 2015-07-01 DIAGNOSIS — Z7901 Long term (current) use of anticoagulants: Secondary | ICD-10-CM | POA: Diagnosis not present

## 2015-07-01 DIAGNOSIS — I10 Essential (primary) hypertension: Secondary | ICD-10-CM | POA: Diagnosis not present

## 2015-07-01 DIAGNOSIS — Z79899 Other long term (current) drug therapy: Secondary | ICD-10-CM | POA: Diagnosis not present

## 2015-07-01 DIAGNOSIS — Y9389 Activity, other specified: Secondary | ICD-10-CM | POA: Insufficient documentation

## 2015-07-01 DIAGNOSIS — Y998 Other external cause status: Secondary | ICD-10-CM | POA: Insufficient documentation

## 2015-07-01 DIAGNOSIS — Z86711 Personal history of pulmonary embolism: Secondary | ICD-10-CM | POA: Diagnosis not present

## 2015-07-01 MED ORDER — CYCLOBENZAPRINE HCL 10 MG PO TABS: 10.0000 mg | ORAL_TABLET | Freq: Two times a day (BID) | ORAL | Status: AC | PRN

## 2015-07-01 NOTE — ED Notes (Signed)
Pt sts left lower back and leg pain; pt sts worse after hip "popped" last night
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
## 2015-07-01 NOTE — Discharge Instructions (Signed)
Please read and follow all provided instructions.  Your diagnoses today include:  1. Left-sided low back pain with left-sided sciatica    Tests performed today include:  Vital signs - see below for your results today  Medications prescribed:   Take any prescribed medications only as directed.  Home care instructions:   Follow any educational materials contained in this packet  Please rest, use ice or heat on your back for the next several days  Do not lift, push, pull anything more than 10 pounds for the next week  Follow-up instructions: Please follow-up with your primary care provider in the next 1 week for further evaluation of your symptoms.   Return instructions:  SEEK IMMEDIATE MEDICAL ATTENTION IF YOU HAVE:  New numbness, tingling, weakness, or problem with the use of your arms or legs  Severe back pain not relieved with medications  Loss control of your bowels or bladder  Increasing pain in any areas of the body (such as chest or abdominal pain)  Shortness of breath, dizziness, or fainting.   Worsening nausea (feeling sick to your stomach), vomiting, fever, or sweats  Any other emergent concerns regarding your health   Additional Information:  Your vital signs today were: BP 143/94 mmHg   Pulse 97   Temp(Src) 98 F (36.7 C) (Oral)   Resp 18   Ht 6' (1.829 m)   Wt 115.667 kg   BMI 34.58 kg/m2   SpO2 96% If your blood pressure (BP) was elevated above 135/85 this visit, please have this repeated by your doctor within one month. --------------

## 2015-07-02 ENCOUNTER — Ambulatory Visit (INDEPENDENT_AMBULATORY_CARE_PROVIDER_SITE_OTHER): Payer: BLUE CROSS/BLUE SHIELD | Admitting: Family Medicine

## 2015-07-02 ENCOUNTER — Encounter: Payer: Self-pay | Admitting: Family Medicine

## 2015-07-02 VITALS — BP 130/88 | HR 80 | Ht 72.0 in | Wt 248.0 lb

## 2015-07-02 DIAGNOSIS — M5416 Radiculopathy, lumbar region: Secondary | ICD-10-CM | POA: Diagnosis not present

## 2015-07-02 DIAGNOSIS — M5432 Sciatica, left side: Secondary | ICD-10-CM

## 2015-07-02 MED ORDER — TRAMADOL HCL 50 MG PO TABS: 50.0000 mg | ORAL_TABLET | Freq: Three times a day (TID) | ORAL | Status: DC | PRN
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
## 2015-07-23 ENCOUNTER — Other Ambulatory Visit: Payer: Self-pay | Admitting: Orthopedic Surgery

## 2015-07-23 DIAGNOSIS — M5416 Radiculopathy, lumbar region: Secondary | ICD-10-CM

## 2015-07-24 ENCOUNTER — Ambulatory Visit
Admission: RE | Admit: 2015-07-24 | Discharge: 2015-07-24 | Disposition: A | Discharge status: Home / Self Care | Payer: BLUE CROSS/BLUE SHIELD | Source: Ambulatory Visit | Attending: Orthopedic Surgery | Admitting: Orthopedic Surgery

## 2015-07-24 DIAGNOSIS — M4806 Spinal stenosis, lumbar region: Secondary | ICD-10-CM | POA: Diagnosis not present

## 2015-07-24 DIAGNOSIS — M5126 Other intervertebral disc displacement, lumbar region: Secondary | ICD-10-CM | POA: Insufficient documentation

## 2015-07-24 DIAGNOSIS — M5416 Radiculopathy, lumbar region: Secondary | ICD-10-CM

## 2015-08-14 ENCOUNTER — Ambulatory Visit: Payer: BLUE CROSS/BLUE SHIELD | Admitting: Family Medicine

## 2015-08-14 DIAGNOSIS — M109 Gout, unspecified: Secondary | ICD-10-CM | POA: Insufficient documentation

## 2015-08-14 DIAGNOSIS — N529 Male erectile dysfunction, unspecified: Secondary | ICD-10-CM | POA: Insufficient documentation

## 2015-08-14 DIAGNOSIS — Z86711 Personal history of pulmonary embolism: Secondary | ICD-10-CM | POA: Insufficient documentation

## 2015-08-14 DIAGNOSIS — K219 Gastro-esophageal reflux disease without esophagitis: Secondary | ICD-10-CM | POA: Insufficient documentation

## 2015-08-15 ENCOUNTER — Ambulatory Visit: Payer: BLUE CROSS/BLUE SHIELD | Admitting: Family Medicine

## 2015-08-19 ENCOUNTER — Encounter: Payer: Self-pay | Admitting: Family Medicine

## 2015-08-19 ENCOUNTER — Ambulatory Visit (INDEPENDENT_AMBULATORY_CARE_PROVIDER_SITE_OTHER): Payer: BLUE CROSS/BLUE SHIELD | Admitting: Family Medicine

## 2015-08-19 VITALS — BP 110/80 | HR 80 | Ht 72.0 in | Wt 247.0 lb

## 2015-08-19 DIAGNOSIS — Z01818 Encounter for other preprocedural examination: Secondary | ICD-10-CM

## 2015-08-19 DIAGNOSIS — Z23 Encounter for immunization: Secondary | ICD-10-CM | POA: Diagnosis not present

## 2015-08-19 DIAGNOSIS — E785 Hyperlipidemia, unspecified: Secondary | ICD-10-CM | POA: Diagnosis not present

## 2015-08-19 MED ORDER — LOVASTATIN 20 MG PO TABS: ORAL_TABLET | ORAL | Status: AC

## 2015-08-19 NOTE — Progress Notes (Signed)
Name: Jeffery Howell   MRN: [id number]    DOB: 1965-05-28   Date:08/19/2015       Progress Note  Subjective  Chief Complaint  Chief Complaint  Patient presents with  . Pre-op Exam    surgery on lumbar spine with Dr Clotilde Dietererian- 08/22/2015    HPI Comments: Patient presents for presurgical evaluation.   No problem-specific assessment & plan notes found for this encounter.   Past Medical History  Diagnosis Date  . Diabetes mellitus without complication (HCC)   . Hypertension   . Hyperlipidemia   . Pulmonary embolism (HCC)   . Thyroid disease   . Gout     Past Surgical History  Procedure Laterality Date  . None      Family History  Problem Relation Age of Onset  . Diabetes Father   . Heart disease Father   . Diabetes Sister     Social History   Social History  . Marital Status: Single    Spouse Name: N/A  . Number of Children: N/A  . Years of Education: N/A   Occupational History  . Not on file.   Social History Main Topics  . Smoking status: Never Smoker   . Smokeless tobacco: Not on file  . Alcohol Use: 0.0 oz/week    0 Standard drinks or equivalent per week  . Drug Use: No  . Sexual Activity: Yes   Other Topics Concern  . Not on file   Social History Narrative    Allergies  Allergen Reactions  . Sulfa Antibiotics Other (See Comments)     Review of Systems  Constitutional: Negative for fever, chills, weight loss and malaise/fatigue.  HENT: Negative for ear discharge, ear pain and sore throat.   Eyes: Negative for blurred vision.  Respiratory: Negative for cough, sputum production, shortness of breath and wheezing.   Cardiovascular: Negative for chest pain, palpitations and leg swelling.  Gastrointestinal: Negative for heartburn, nausea, abdominal pain, diarrhea, constipation, blood in stool and melena.  Genitourinary: Negative for dysuria, urgency, frequency and hematuria.  Musculoskeletal: Positive for back pain. Negative for myalgias, joint pain  and neck pain.  Skin: Negative for rash.  Neurological: Negative for dizziness, tingling, sensory change, focal weakness and headaches.  Endo/Heme/Allergies: Negative for environmental allergies and polydipsia. Does not bruise/bleed easily.  Psychiatric/Behavioral: Negative for depression and suicidal ideas. The patient is not nervous/anxious and does not have insomnia.      Objective  Filed Vitals:   08/19/15 1006  BP: 110/80  Pulse: 80  Height: 6' (1.829 m)  Weight: 247 lb (112.038 kg)    Physical Exam  Constitutional: He is oriented to person, place, and time and well-developed, well-nourished, and in no distress.  HENT:  Head: Normocephalic.  Right Ear: External ear normal.  Left Ear: External ear normal.  Nose: Nose normal.  Mouth/Throat: Oropharynx is clear and moist.  Eyes: Conjunctivae and EOM are normal. Pupils are equal, round, and reactive to light. Right eye exhibits no discharge. Left eye exhibits no discharge. No scleral icterus.  Neck: Normal range of motion. Neck supple. No JVD present. No tracheal deviation present. No thyromegaly present.  Cardiovascular: Normal rate, regular rhythm, normal heart sounds and intact distal pulses.  Exam reveals no gallop and no friction rub.   No murmur heard. Pulmonary/Chest: Breath sounds normal. No respiratory distress. He has no wheezes. He has no rales.  Abdominal: Soft. Bowel sounds are normal. He exhibits no mass. There is no hepatosplenomegaly. There is no  tenderness. There is no rebound, no guarding and no CVA tenderness.  Genitourinary: Rectum normal and prostate normal.  Musculoskeletal: Normal range of motion. He exhibits no edema or tenderness.  Lymphadenopathy:    He has no cervical adenopathy.  Neurological: He is alert and oriented to person, place, and time. He has normal sensation, normal strength, normal reflexes and intact cranial nerves. No cranial nerve deficit.  Skin: Skin is warm. No rash noted.   Psychiatric: Mood and affect normal.  Nursing note and vitals reviewed.     Assessment & Plan  Problem List Items Addressed This Visit      Other   Hyperlipidemia   Relevant Medications   lovastatin (MEVACOR) 20 MG tablet    Other Visit Diagnoses    Preoperative clearance    -  Primary    patient cleared for surgical procedure    Need for Tdap vaccination        Relevant Orders    Tdap vaccine greater than or equal to 7yo IM (Completed)      Proceed with surgery on lumbar spine with Dr Clotilde Dieter   Dr. [facility]  08/19/2015

## 2015-08-22 DIAGNOSIS — M48061 Spinal stenosis, lumbar region without neurogenic claudication: Secondary | ICD-10-CM | POA: Insufficient documentation

## 2015-08-22 DIAGNOSIS — M5106 Intervertebral disc disorders with myelopathy, lumbar region: Secondary | ICD-10-CM | POA: Insufficient documentation

## 2015-08-22 DIAGNOSIS — Z9889 Other specified postprocedural states: Secondary | ICD-10-CM | POA: Insufficient documentation

## 2015-09-15 ENCOUNTER — Emergency Department: Payer: BLUE CROSS/BLUE SHIELD

## 2015-09-15 ENCOUNTER — Encounter: Payer: Self-pay | Admitting: Emergency Medicine

## 2015-09-15 ENCOUNTER — Emergency Department
Admission: EM | Admit: 2015-09-15 | Discharge: 2015-09-16 | Disposition: A | Discharge status: Other Acute Inpatient Hospital | Payer: BLUE CROSS/BLUE SHIELD | Attending: Emergency Medicine | Admitting: Emergency Medicine

## 2015-09-15 DIAGNOSIS — Z7984 Long term (current) use of oral hypoglycemic drugs: Secondary | ICD-10-CM | POA: Insufficient documentation

## 2015-09-15 DIAGNOSIS — T819XXA Unspecified complication of procedure, initial encounter: Secondary | ICD-10-CM | POA: Diagnosis present

## 2015-09-15 DIAGNOSIS — L03319 Cellulitis of trunk, unspecified: Secondary | ICD-10-CM

## 2015-09-15 DIAGNOSIS — E119 Type 2 diabetes mellitus without complications: Secondary | ICD-10-CM | POA: Insufficient documentation

## 2015-09-15 DIAGNOSIS — L02212 Cutaneous abscess of back [any part, except buttock]: Secondary | ICD-10-CM | POA: Insufficient documentation

## 2015-09-15 DIAGNOSIS — Y69 Unspecified misadventure during surgical and medical care: Secondary | ICD-10-CM | POA: Diagnosis not present

## 2015-09-15 DIAGNOSIS — I10 Essential (primary) hypertension: Secondary | ICD-10-CM | POA: Insufficient documentation

## 2015-09-15 DIAGNOSIS — Z79899 Other long term (current) drug therapy: Secondary | ICD-10-CM | POA: Diagnosis not present

## 2015-09-15 DIAGNOSIS — E785 Hyperlipidemia, unspecified: Secondary | ICD-10-CM | POA: Diagnosis not present

## 2015-09-15 DIAGNOSIS — L0291 Cutaneous abscess, unspecified: Secondary | ICD-10-CM

## 2015-09-15 DIAGNOSIS — Z86718 Personal history of other venous thrombosis and embolism: Secondary | ICD-10-CM | POA: Insufficient documentation

## 2015-09-15 LAB — BASIC METABOLIC PANEL
Anion gap: 13 (ref 5–15)
BUN: 23 mg/dL — AB (ref 6–20)
CALCIUM: 9.4 mg/dL (ref 8.9–10.3)
CHLORIDE: 97 mmol/L — AB (ref 101–111)
CO2: 24 mmol/L (ref 22–32)
CREATININE: 1.18 mg/dL (ref 0.61–1.24)
GFR calc non Af Amer: >60 mL/min (ref >60)
GLUCOSE: 304 mg/dL — AB (ref 65–99)
Potassium: 3.7 mmol/L (ref 3.5–5.1)
Sodium: 134 mmol/L — ABNORMAL LOW (ref 135–145)

## 2015-09-15 LAB — CBC WITH DIFFERENTIAL/PLATELET
BASOS PCT: 0 %
Basophils Absolute: 0 10*3/uL (ref 0–0.1)
Eosinophils Absolute: 0 10*3/uL (ref 0–0.7)
Eosinophils Relative: 0 %
HEMATOCRIT: 45.6 % (ref 40.0–52.0)
HEMOGLOBIN: 15.4 g/dL (ref 13.0–18.0)
LYMPHS ABS: 1.7 10*3/uL (ref 1.0–3.6)
Lymphocytes Relative: 14 %
MCH: 31.2 pg (ref 26.0–34.0)
MCHC: 33.8 g/dL (ref 32.0–36.0)
MCV: 92.4 fL (ref 80.0–100.0)
MONO ABS: 1.3 10*3/uL — AB (ref 0.2–1.0)
MONOS PCT: 10 %
NEUTROS ABS: 9.4 10*3/uL — AB (ref 1.4–6.5)
NEUTROS PCT: 76 %
Platelets: 237 10*3/uL (ref 150–440)
RBC: 4.94 MIL/uL (ref 4.40–5.90)
RDW: 14 % (ref 11.5–14.5)
WBC: 12.4 10*3/uL — ABNORMAL HIGH (ref 3.8–10.6)

## 2015-09-15 LAB — LACTIC ACID, PLASMA: LACTIC ACID, VENOUS: 1.4 mmol/L (ref 0.5–1.9)

## 2015-09-15 MED ORDER — SODIUM CHLORIDE 0.9 % IV BOLUS (SEPSIS)
500.0000 mL | Freq: Once | INTRAVENOUS | Status: AC
  Administered 2015-09-15: 500 mL via INTRAVENOUS

## 2015-09-15 MED ORDER — DEXTROSE 5 % IV SOLN
2.0000 g | INTRAVENOUS | Status: AC
  Administered 2015-09-15: 2 g via INTRAVENOUS
  Filled 2015-09-15: qty 2

## 2015-09-15 MED ORDER — METRONIDAZOLE IN NACL 5-0.79 MG/ML-% IV SOLN
500.0000 mg | Freq: Once | INTRAVENOUS | Status: AC
  Administered 2015-09-15: 500 mg via INTRAVENOUS
  Filled 2015-09-15: qty 100

## 2015-09-15 MED ORDER — GADOBENATE DIMEGLUMINE 529 MG/ML IV SOLN
20.0000 mL | Freq: Once | INTRAVENOUS | Status: AC | PRN
  Administered 2015-09-15: 20 mL via INTRAVENOUS

## 2015-09-15 MED ORDER — SODIUM CHLORIDE 0.9 % IV BOLUS (SEPSIS)
1000.0000 mL | Freq: Once | INTRAVENOUS | Status: AC
  Administered 2015-09-15: 1000 mL via INTRAVENOUS

## 2015-09-15 MED ORDER — ONDANSETRON HCL 4 MG/2ML IJ SOLN
4.0000 mg | Freq: Once | INTRAMUSCULAR | Status: AC
  Administered 2015-09-15: 4 mg via INTRAVENOUS
  Filled 2015-09-15: qty 2

## 2015-09-15 MED ORDER — VANCOMYCIN HCL 10 G IV SOLR
1500.0000 mg | INTRAVENOUS | Status: AC
  Administered 2015-09-15: 1500 mg via INTRAVENOUS
  Filled 2015-09-15: qty 1500

## 2015-09-15 MED ORDER — PIPERACILLIN-TAZOBACTAM 3.375 G IVPB: 3.3750 g | INTRAVENOUS | Status: DC

## 2015-09-15 MED ORDER — MORPHINE SULFATE (PF) 4 MG/ML IV SOLN
4.0000 mg | Freq: Once | INTRAVENOUS | Status: AC
  Administered 2015-09-15: 4 mg via INTRAVENOUS
  Filled 2015-09-15: qty 1

## 2015-09-15 NOTE — ED Notes (Signed)
Pt back from MRI 

## 2015-09-15 NOTE — ED Notes (Signed)
Patient transported to MRI 

## 2015-09-15 NOTE — ED Notes (Addendum)
Patient presents to ED via wheelchair, pt reports had back surgery on June 9 for ruptured disc. Pt states noticed incision site located to lower was open.  Draininage noted, pt states he was laying in bed when he noticed. Pt denies fever at home, shortness of breath, or chest pain. Pt alert and oriented x 4, no increased work in breathing noted.

## 2015-09-15 NOTE — ED Provider Notes (Signed)
[facility] Emergency Department Provider Note   ____________________________________________  Time seen: Approximately 8 PM  I have reviewed the triage vital signs and the nursing notes.   HISTORY  Chief Complaint Post-op Problem   HPI Jeffery Howell is a 50 y.o. male with a history of diabetes, hypertension and lumbar surgery on June 9 of this month [facility] who is presenting to the emergency department today with drainage from his incision site. He says there is very little pain. Says his pain is about a 1 out of 10. However, his wife said that she saw "gushing" earlier today from the incision which appears to have an opening at both the top and the bottom.Patient denies any fever. Says that he usually does not have a fast heart rate.   Past Medical History  Diagnosis Date  . Diabetes mellitus without complication (HCC)   . Hypertension   . Hyperlipidemia   . Pulmonary embolism (HCC)   . Thyroid disease   . Gout     Patient Active Problem List   Diagnosis Date Noted  . Essential hypertension 11/19/2014  . Esophageal reflux 11/19/2014  . Hyperlipidemia 11/19/2014  . Myalgia 11/19/2014  . Chronic infection of sinus 11/19/2014  . B12 deficiency 09/23/2013  . Microalbuminuria 09/23/2013  . Adiposity 09/13/2013  . Adult hypothyroidism 09/13/2013  . Diabetes mellitus, type 2 (HCC) 09/13/2013  . Calculus of kidney 03/03/2012  . Nephrolithiasis, uric acid 12/02/2011    Past Surgical History  Procedure Laterality Date  . None    . Back surgery      Current Outpatient Rx  Name  Route  Sig  Dispense  Refill  . allopurinol (ZYLOPRIM) 300 MG tablet   Oral   Take 300 mg by mouth daily at 6 (six) AM.         . cyclobenzaprine (FLEXERIL) 10 MG tablet   Oral   Take 1 tablet (10 mg total) by mouth 2 (two) times daily as needed for muscle spasms.   20 tablet   0   . dapagliflozin propanediol (FARXIGA) 10 MG TABS tablet   Oral  Take 10 mg by mouth daily at 6 (six) AM. Dr Renae FicklePaul         . Dulaglutide (TRULICITY) 1.5 MG/0.5ML SOPN   Injection   Inject 1.5 mg as directed once a week. Dr Renae FicklePaul         . gabapentin (NEURONTIN) 100 MG capsule   Oral   Take 1 capsule (100 mg total) by mouth 3 (three) times daily.   90 capsule   3   . levothyroxine (SYNTHROID, LEVOTHROID) 100 MCG tablet   Oral   Take 1 tablet by mouth daily at 6 (six) AM.         . lisinopril-hydrochlorothiazide (PRINZIDE,ZESTORETIC) 10-12.5 MG tablet      TAKE (1) TABLET BY MOUTH EVERY DAY   30 tablet   6     Pt not seen in a year- sched appt   . lovastatin (MEVACOR) 20 MG tablet      One a day   30 tablet   6     Pt not seen in a year- sched appt   . metFORMIN (GLUCOPHAGE) 500 MG tablet   Oral   Take 1,000 mg by mouth 2 (two) times daily. Dr Renae FicklePaul         . pantoprazole (PROTONIX) 40 MG tablet   Oral   Take 1 tablet (40 mg total)  by mouth daily at 6 (six) AM.   30 tablet   6   . traMADol (ULTRAM) 50 MG tablet   Oral   Take 1 tablet (50 mg total) by mouth every 8 (eight) hours as needed.   30 tablet   0   . XARELTO 15 MG TABS tablet      TAKE ONE TABLET AT BEDTIME.   15 tablet   0     Needs appt     Allergies Sulfa antibiotics  Family History  Problem Relation Age of Onset  . Diabetes Father   . Heart disease Father   . Diabetes Sister     Social History Social History  Substance Use Topics  . Smoking status: Never Smoker   . Smokeless tobacco: None  . Alcohol Use: 0.0 oz/week    0 Standard drinks or equivalent per week    Review of Systems Constitutional: No fever/chills Eyes: No visual changes. ENT: No sore throat. Cardiovascular: Denies chest pain. Respiratory: Denies shortness of breath. Gastrointestinal: No abdominal pain.  No nausea, no vomiting.  No diarrhea.  No constipation. Genitourinary: Negative for dysuria. Musculoskeletal: As above Skin:  as above Neurological: Negative for  headaches, focal weakness or numbness.  10-point ROS otherwise negative.  ____________________________________________   PHYSICAL EXAM:  VITAL SIGNS: ED Triage Vitals  Enc Vitals Group     BP 09/15/15 1926 127/84 mmHg     Pulse Rate 09/15/15 1926 125     Resp 09/15/15 1926 18     Temp 09/15/15 1926 98.7 F (37.1 C)     Temp Source 09/15/15 1926 Oral     SpO2 09/15/15 1926 99 %     Weight 09/15/15 1926 245 lb (111.131 kg)     Height 09/15/15 1926 6\' 1"  (1.854 m)     Head Cir --      Peak Flow --      Pain Score 09/15/15 1927 1     Pain Loc --      Pain Edu? --      Excl. in GC? --     Constitutional: Alert and oriented. Well appearing and in no acute distress. Eyes: Conjunctivae are normal. PERRL. EOMI. Head: Atraumatic. Nose: No congestion/rhinnorhea. Mouth/Throat: Mucous membranes are moist.  Neck: No stridor.   Cardiovascular: Tachycardic, regular rhythm. Grossly normal heart sounds.  Good peripheral circulation. Respiratory: Normal respiratory effort.  No retractions. Lungs CTAB. Gastrointestinal: Soft and nontender. No distention.  Musculoskeletal: No lower extremity tenderness nor edema.  No joint effusions. Neurologic:  Normal speech and language. No gross focal neurologic deficits are appreciated. No gait instability. Skin:  Midline lumbar incision which is 10 cm long with 1 cm area of dehiscence to the superior aspect with purulent drainage. Mild surrounding erythema to the entire incision without any fluctuance. Also 1 cm area at the inferior aspect which is dehisced. Copious amount of purulent drainage from this area as well. Psychiatric: Mood and affect are normal. Speech and behavior are normal.  ____________________________________________   LABS (all labs ordered are listed, but only abnormal results are displayed)  Labs Reviewed  CBC WITH DIFFERENTIAL/PLATELET - Abnormal; Notable for the following:    WBC 12.4 (*)    Neutro Abs 9.4 (*)    Monocytes  Absolute 1.3 (*)    All other components within normal limits  BASIC METABOLIC PANEL - Abnormal; Notable for the following:    Sodium 134 (*)    Chloride 97 (*)    Glucose,  Bld 304 (*)    BUN 23 (*)    All other components within normal limits  CULTURE, BLOOD (ROUTINE X 2)  CULTURE, BLOOD (ROUTINE X 2)  URINE CULTURE  LACTIC ACID, PLASMA  LACTIC ACID, PLASMA  URINALYSIS COMPLETEWITH MICROSCOPIC ([facility] ONLY)   ____________________________________________  EKG   ____________________________________________  RADIOLOGY  IMPRESSION: Interval LEFT L3-4 laminectomy and probable redo L4-5 and L5-S1 laminotomies. 8 x 20 x 11 mm rim enhancing fluid collection within the LEFT L3-4 laminectomy bed concerning for abscess though, resolving hematoma may have a similar appearance. Associated granulation tissue versus phlegmon within the LEFT posterolateral epidural space could affect the traversing LEFT L4 nerve. Enhancing LEFT S1 nerve most compatible with neuritis.  3 x 3.1 x 5.9 cm ill-defined fluid collection within the paraspinal soft tissues with imaging characteristics more favoring seroma though sampling would be definitive.  No MR findings of discitis osteomyelitis. No fracture or malalignment.  Mild canal stenosis L4-5. Mild thecal sac effacement at L3-4. Similar neural foraminal narrowing: Moderate to severe on the RIGHT at L4-5.   Electronically Signed By: Awilda Metro M.D. On: 09/15/2015 22:20       ____________________________________________   PROCEDURES    Procedures   ____________________________________________   INITIAL IMPRESSION / ASSESSMENT AND PLAN / ED COURSE  Pertinent labs & imaging results that were available during my care of the patient were reviewed by me and considered in my medical decision making (see chart for details).  ----------------------------------------- 820 PM on  09/15/2015 ----------------------------------------- Consultation with Dr. Micheline Maze of radiology. Recommends MRI with contrast to examine both the superficial tissues as well as possible deep space infection abutting hardware.  ----------------------------------------- 11:38 PM on 09/15/2015 -----------------------------------------  MRI read came back as likely abscess with fluid collection around both the laminectomy site as well as in the soft tissues. Patient made sepsis alert. He called out to [facility] transfer center for transfer of this patient. Patient made aware of plan for transfer. Antibiotics ordered. At this time I have had a second call from [facility] transfer center as of 11:35 PM. They said that the neurosurgical service is very busy at this time and they will be calling back as soon as they have the neurosurgeon available. Signed out to Dr. Dolores Frame. Patient remains neurologically intact at this time. No weakness or numbness. ____________________________________________   FINAL CLINICAL IMPRESSION(S) / ED DIAGNOSES  Final diagnoses:  Abscess  Lumbar abscess.     NEW MEDICATIONS STARTED DURING THIS VISIT:  New Prescriptions   No medications on file     Note:  This document was prepared using Dragon voice recognition software and may include unintentional dictation errors.    Myrna Blazer, MD 09/15/15 [phone]

## 2015-09-15 NOTE — ED Notes (Signed)
MD at bedside. 

## 2015-09-15 NOTE — ED Notes (Signed)
MD made aware of increase in pain

## 2015-09-16 DIAGNOSIS — G061 Intraspinal abscess and granuloma: Secondary | ICD-10-CM | POA: Insufficient documentation

## 2015-09-16 DIAGNOSIS — T8149XA Infection following a procedure, other surgical site, initial encounter: Secondary | ICD-10-CM | POA: Insufficient documentation

## 2015-09-16 LAB — URINALYSIS COMPLETE WITH MICROSCOPIC (ARMC ONLY)
Bacteria, UA: NONE SEEN
Bilirubin Urine: NEGATIVE
Glucose, UA: >500 mg/dL — AB
Leukocytes, UA: NEGATIVE
Nitrite: NEGATIVE
PH: 5 (ref 5.0–8.0)
PROTEIN: NEGATIVE mg/dL
SQUAMOUS EPITHELIAL / LPF: NONE SEEN
Specific Gravity, Urine: 1.031 — ABNORMAL HIGH (ref 1.005–1.030)

## 2015-09-16 LAB — LACTIC ACID, PLASMA: Lactic Acid, Venous: 1.1 mmol/L (ref 0.5–1.9)

## 2015-09-16 MED ORDER — MORPHINE SULFATE (PF) 4 MG/ML IV SOLN
INTRAVENOUS | Status: AC
  Administered 2015-09-16: 4 mg via INTRAVENOUS
  Filled 2015-09-16: qty 1

## 2015-09-16 MED ORDER — MORPHINE SULFATE (PF) 4 MG/ML IV SOLN
4.0000 mg | Freq: Once | INTRAVENOUS | Status: AC
  Administered 2015-09-16: 4 mg via INTRAVENOUS

## 2015-09-16 MED ORDER — ONDANSETRON HCL 4 MG/2ML IJ SOLN
4.0000 mg | Freq: Once | INTRAMUSCULAR | Status: AC
  Administered 2015-09-16: 4 mg via INTRAVENOUS

## 2015-09-16 MED ORDER — ONDANSETRON HCL 4 MG/2ML IJ SOLN
INTRAMUSCULAR | Status: AC
  Administered 2015-09-16: 4 mg via INTRAVENOUS
  Filled 2015-09-16: qty 2

## 2015-09-16 NOTE — ED Notes (Signed)
MD made aware HR and temp when down. Pt awaiting a bed at [facility].

## 2015-09-16 NOTE — ED Notes (Signed)
Pt had 1L NS at 2110. Pt only needs 3500ML for sepsis protocol.

## 2015-09-16 NOTE — ED Provider Notes (Signed)
-----------------------------------------   12:51 AM on 09/16/2015 -----------------------------------------  Discussed with Dr. Derry SkillBatley who [facility]. Patient updated. Will be transferred via local EMS once bed is assigned.  Irean HongJade J Kristopher Attwood, MD 09/16/15 [phone]

## 2015-09-16 NOTE — ED Notes (Signed)
Pt is acceted to [facility] awaiting a bed assignment. Pt given lemon glyerin swabs.

## 2015-09-17 LAB — URINE CULTURE: Culture: NO GROWTH

## 2015-09-20 LAB — CULTURE, BLOOD (ROUTINE X 2)
CULTURE: NO GROWTH
CULTURE: NO GROWTH

## 2015-09-24 ENCOUNTER — Ambulatory Visit (INDEPENDENT_AMBULATORY_CARE_PROVIDER_SITE_OTHER): Payer: BLUE CROSS/BLUE SHIELD | Admitting: Family Medicine

## 2015-09-24 ENCOUNTER — Encounter: Payer: Self-pay | Admitting: Family Medicine

## 2015-09-24 VITALS — BP 120/86 | HR 64 | Ht 73.0 in | Wt 240.0 lb

## 2015-09-24 DIAGNOSIS — R233 Spontaneous ecchymoses: Secondary | ICD-10-CM

## 2015-09-24 MED ORDER — CLOTRIMAZOLE-BETAMETHASONE 1-0.05 % EX CREA: 1.0000 "application " | TOPICAL_CREAM | Freq: Two times a day (BID) | CUTANEOUS | Status: AC

## 2015-09-24 NOTE — Progress Notes (Signed)
Name: Jeffery Howell   MRN: [id number]    DOB: 07-01-1965   Date:09/24/2015       Progress Note  Subjective  Chief Complaint  Chief Complaint  Patient presents with  . Cellulitis    discoloration of both legs- looks better but still darker in areas    Rash This is a new problem. The current episode started in the past 7 days. The problem is unchanged. The affected locations include the left lower leg and right lowerleg. Associated with: compression stockings earlier. Pertinent negatives include no anorexia, congestion, cough, diarrhea, eye pain, fatigue, fever, joint pain, nail changes, rhinorrhea, shortness of breath, sore throat or vomiting. Past treatments include nothing. The treatment provided no relief. There is no history of allergies.    No problem-specific assessment & plan notes found for this encounter.   Past Medical History  Diagnosis Date  . Diabetes mellitus without complication (HCC)   . Hypertension   . Hyperlipidemia   . Pulmonary embolism (HCC)   . Thyroid disease   . Gout     Past Surgical History  Procedure Laterality Date  . None    . Back surgery      Family History  Problem Relation Age of Onset  . Diabetes Father   . Heart disease Father   . Diabetes Sister     Social History   Social History  . Marital Status: Single    Spouse Name: N/A  . Number of Children: N/A  . Years of Education: N/A   Occupational History  . Not on file.   Social History Main Topics  . Smoking status: Never Smoker   . Smokeless tobacco: Not on file  . Alcohol Use: 0.0 oz/week    0 Standard drinks or equivalent per week  . Drug Use: No  . Sexual Activity: Yes   Other Topics Concern  . Not on file   Social History Narrative    Allergies  Allergen Reactions  . Sulfa Antibiotics Other (See Comments)     Review of Systems  Constitutional: Negative for fever, chills, weight loss, malaise/fatigue and fatigue.  HENT: Negative for congestion, ear  discharge, ear pain, rhinorrhea and sore throat.   Eyes: Negative for blurred vision and pain.  Respiratory: Negative for cough, sputum production, shortness of breath and wheezing.   Cardiovascular: Negative for chest pain, palpitations and leg swelling.  Gastrointestinal: Negative for heartburn, nausea, vomiting, abdominal pain, diarrhea, constipation, blood in stool, melena and anorexia.  Genitourinary: Negative for dysuria, urgency, frequency and hematuria.  Musculoskeletal: Negative for myalgias, back pain, joint pain and neck pain.  Skin: Positive for rash. Negative for nail changes.  Neurological: Negative for dizziness, tingling, sensory change, focal weakness and headaches.  Endo/Heme/Allergies: Negative for environmental allergies and polydipsia. Does not bruise/bleed easily.  Psychiatric/Behavioral: Negative for depression and suicidal ideas. The patient is not nervous/anxious and does not have insomnia.      Objective  Filed Vitals:   09/24/15 0947  BP: 120/86  Pulse: 64  Height:  (1.854 m)  Weight: 240 lb (108.863 kg)    Physical Exam  Constitutional: He is oriented to person, place, and time and well-developed, well-nourished, and in no distress.  HENT:  Head: Normocephalic.  Right Ear: External ear normal.  Left Ear: External ear normal.  Nose: Nose normal.  Mouth/Throat: Oropharynx is clear and moist.  Eyes: Conjunctivae and EOM are normal. Pupils are equal, round, and reactive to light. Right eye exhibits no discharge.  Left eye exhibits no discharge. No scleral icterus.  Neck: Normal range of motion. Neck supple. No JVD present. No tracheal deviation present. No thyromegaly present.  Cardiovascular: Normal rate, regular rhythm, normal heart sounds and intact distal pulses.  Exam reveals no gallop and no friction rub.   No murmur heard. Pulmonary/Chest: Breath sounds normal. No respiratory distress. He has no wheezes. He has no rales.  Abdominal: Soft. Bowel  sounds are normal. He exhibits no mass. There is no hepatosplenomegaly. There is no tenderness. There is no rebound, no guarding and no CVA tenderness.  Musculoskeletal: Normal range of motion. He exhibits no edema or tenderness.  Lymphadenopathy:    He has no cervical adenopathy.  Neurological: He is alert and oriented to person, place, and time. He has normal sensation, normal strength, normal reflexes and intact cranial nerves. No cranial nerve deficit.  Skin: Skin is warm. Ecchymosis, petechiae and rash noted.  Psychiatric: Mood and affect normal.  Nursing note and vitals reviewed.     Assessment & Plan  Problem List Items Addressed This Visit    None    Visit Diagnoses    Petechiae or ecchymoses    -  Primary    ? secondary to compression pumping    Relevant Orders    CBC w/Diff/Platelet         Dr. Elizabeth Sauereanna Jones [facility]  09/24/2015

## 2015-09-25 LAB — CBC WITH DIFFERENTIAL/PLATELET
BASOS ABS: 0 10*3/uL (ref 0.0–0.2)
BASOS: 0 %
EOS (ABSOLUTE): 0.1 10*3/uL (ref 0.0–0.4)
Eos: 1 %
Hematocrit: 44.1 % (ref 37.5–51.0)
Hemoglobin: 14.4 g/dL (ref 12.6–17.7)
IMMATURE GRANS (ABS): 0.1 10*3/uL (ref 0.0–0.1)
Immature Granulocytes: 1 %
LYMPHS ABS: 2.1 10*3/uL (ref 0.7–3.1)
Lymphs: 20 %
MCH: 30.6 pg (ref 26.6–33.0)
MCHC: 32.7 g/dL (ref 31.5–35.7)
MCV: 94 fL (ref 79–97)
MONOS ABS: 0.7 10*3/uL (ref 0.1–0.9)
Monocytes: 6 %
NEUTROS ABS: 7.8 10*3/uL — AB (ref 1.4–7.0)
Neutrophils: 72 %
PLATELETS: 249 10*3/uL (ref 150–379)
RBC: 4.71 x10E6/uL (ref 4.14–5.80)
RDW: 13.6 % (ref 12.3–15.4)
WBC: 10.8 10*3/uL (ref 3.4–10.8)

## 2015-10-09 ENCOUNTER — Other Ambulatory Visit: Payer: Self-pay

## 2015-10-14 DEATH — deceased
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
